# Patient Record
Sex: Male | Born: 1975 | Race: Black or African American | Hispanic: No | Marital: Married | Smoking: Never smoker
Health system: Southern US, Community
[De-identification: ages and names within clinical notes are randomized; demographics above are authoritative.]

---

## 1999-02-12 ENCOUNTER — Emergency Department (HOSPITAL_COMMUNITY): Admission: EM | Admit: 1999-02-12 | Discharge: 1999-02-12 | Payer: Self-pay | Admitting: Internal Medicine

## 1999-02-27 ENCOUNTER — Emergency Department (HOSPITAL_COMMUNITY): Admission: EM | Admit: 1999-02-27 | Discharge: 1999-02-27 | Payer: Self-pay | Admitting: Internal Medicine

## 2000-05-14 ENCOUNTER — Emergency Department (HOSPITAL_COMMUNITY): Admission: EM | Admit: 2000-05-14 | Discharge: 2000-05-14 | Payer: Self-pay | Admitting: Emergency Medicine

## 2000-06-08 ENCOUNTER — Emergency Department (HOSPITAL_COMMUNITY): Admission: EM | Admit: 2000-06-08 | Discharge: 2000-06-08 | Payer: Self-pay | Admitting: Emergency Medicine

## 2000-11-13 ENCOUNTER — Encounter: Payer: Self-pay | Admitting: Emergency Medicine

## 2000-11-13 ENCOUNTER — Emergency Department (HOSPITAL_COMMUNITY): Admission: EM | Admit: 2000-11-13 | Discharge: 2000-11-13 | Payer: Self-pay

## 2001-12-20 ENCOUNTER — Emergency Department (HOSPITAL_COMMUNITY): Admission: EM | Admit: 2001-12-20 | Discharge: 2001-12-20 | Payer: Self-pay | Admitting: Emergency Medicine

## 2001-12-20 ENCOUNTER — Encounter: Payer: Self-pay | Admitting: Emergency Medicine

## 2001-12-21 ENCOUNTER — Emergency Department (HOSPITAL_COMMUNITY): Admission: EM | Admit: 2001-12-21 | Discharge: 2001-12-21 | Payer: Self-pay | Admitting: Emergency Medicine

## 2002-06-29 ENCOUNTER — Inpatient Hospital Stay (HOSPITAL_COMMUNITY): Admission: EM | Admit: 2002-06-29 | Discharge: 2002-07-01 | Payer: Self-pay | Admitting: Emergency Medicine

## 2002-06-29 ENCOUNTER — Encounter: Payer: Self-pay | Admitting: Emergency Medicine

## 2002-06-30 ENCOUNTER — Encounter: Payer: Self-pay | Admitting: Specialist

## 2002-07-13 ENCOUNTER — Encounter: Admission: RE | Admit: 2002-07-13 | Discharge: 2002-08-11 | Payer: Self-pay

## 2002-10-28 ENCOUNTER — Emergency Department (HOSPITAL_COMMUNITY): Admission: EM | Admit: 2002-10-28 | Discharge: 2002-10-28 | Payer: Self-pay | Admitting: Emergency Medicine

## 2006-06-14 ENCOUNTER — Emergency Department (HOSPITAL_COMMUNITY): Admission: EM | Admit: 2006-06-14 | Discharge: 2006-06-14 | Payer: Self-pay | Admitting: Family Medicine

## 2008-08-05 ENCOUNTER — Emergency Department (HOSPITAL_COMMUNITY): Admission: EM | Admit: 2008-08-05 | Discharge: 2008-08-05 | Payer: Self-pay | Admitting: Emergency Medicine

## 2008-11-30 ENCOUNTER — Ambulatory Visit: Payer: Self-pay | Admitting: Gastroenterology

## 2008-11-30 ENCOUNTER — Inpatient Hospital Stay (HOSPITAL_COMMUNITY): Admission: EM | Admit: 2008-11-30 | Discharge: 2008-12-01 | Payer: Self-pay | Admitting: Family Medicine

## 2008-12-01 ENCOUNTER — Encounter: Payer: Self-pay | Admitting: Gastroenterology
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
## 2010-03-26 ENCOUNTER — Emergency Department (HOSPITAL_COMMUNITY): Admission: EM | Admit: 2010-03-26 | Discharge: 2010-03-26 | Payer: Self-pay | Admitting: Emergency Medicine

## 2010-07-03 ENCOUNTER — Emergency Department (HOSPITAL_COMMUNITY): Admission: EM | Admit: 2010-07-03 | Discharge: 2010-07-03 | Payer: Self-pay | Admitting: Emergency Medicine

## 2010-08-02 DIAGNOSIS — M545 Low back pain, unspecified: Secondary | ICD-10-CM | POA: Insufficient documentation

## 2010-10-31 ENCOUNTER — Emergency Department (HOSPITAL_COMMUNITY)
Admission: EM | Admit: 2010-10-31 | Discharge: 2010-10-31 | Payer: Self-pay | Source: Home / Self Care | Admitting: Emergency Medicine

## 2010-11-01 DIAGNOSIS — R209 Unspecified disturbances of skin sensation: Secondary | ICD-10-CM | POA: Insufficient documentation

## 2010-12-14 ENCOUNTER — Ambulatory Visit
Admission: RE | Admit: 2010-12-14 | Discharge: 2010-12-14 | Payer: Self-pay | Source: Home / Self Care | Attending: Nurse Practitioner | Admitting: Nurse Practitioner

## 2010-12-14 ENCOUNTER — Encounter (INDEPENDENT_AMBULATORY_CARE_PROVIDER_SITE_OTHER): Payer: Self-pay | Admitting: Nurse Practitioner

## 2010-12-14 DIAGNOSIS — F17200 Nicotine dependence, unspecified, uncomplicated: Secondary | ICD-10-CM | POA: Insufficient documentation

## 2011-01-03 NOTE — Letter (Signed)
Summary: Handout Printed  Printed Handout:  - Sciatica-Brief

## 2011-01-03 NOTE — Letter (Signed)
Summary: Handout Printed  Printed Handout:  - Back Pain 

## 2011-01-03 NOTE — Assessment & Plan Note (Signed)
Summary: NEW - Establish care   Vital Signs:  Patient profile:   35 year old male Height:      74 inches Weight:      303.8 pounds BMI:     39.15 Temp:     97.2 degrees F oral Pulse rate:   80 / minute Pulse rhythm:   regular Resp:     20 per minute BP sitting:   120 / 76  (left arm) Cuff size:   regular  Vitals Entered By: Levon Hedger (December 14, 2010 3:15 PM)  Nutrition Counseling: Patient's BMI is greater than 25 and therefore counseled on weight management options. CC: new pt .Marland Kitchen..follow-up visit [facility] pt received medication from that visit that he could not get filled with the orange card., Back Pain Is Patient Diabetic? No Pain Assessment Patient in pain? yes     Location: hip, lower Intensity: 10  Does patient need assistance? Functional Status Self care Ambulation Normal   CC:  new pt .Marland Kitchen..follow-up visit [facility] pt received medication from that visit that he could not get filled with the orange card. and Back Pain.  History of Present Illness:  Pt into the office to establish care.  No PMH PSH - ? appendectomy during childhood  Pt went to the ER at [facility] about 1 month ago. States that back pain started in the lower part of his back with radiation into the right leg and into the foot. Numbness in the right leg. Significant other advised pt to go the ER in December. "I can feel my leg but my foot is so numb it feels like somebody is tickling my leg with pens" Denies any injury to the lower back No X-rays done when pt went to the ER.  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  The pain is located in the lower back region and does not radiate below the knees.  He states this is not work related.  He states that he has no prior history of back pain.  The patient has not had any recent physical therapy for his back pain.  The following makes the back  pain better: aleve.  The following makes the back pain worse: activity as the day progresses.    Critical Exclusionary Diagnosis Criteria (CEDC) for Back Pain:      The patient denies a history of previous trauma.  He has no prior history of spinal surgery.  There are no symptoms to suggest infection, cancer, cauda equina, or psychosocial factors for back pain.  Other positive CEDC factors include low back pain worse with lumbar extension (downhill walking-standing-reaching overhead).     Habits & Providers  Alcohol-Tobacco-Diet     Alcohol drinks/day: <1     Tobacco Status: quit     Tobacco Counseling: to quit use of tobacco products     Cigarette Packs/Day: 0.5     Year Started: age 15  Exercise-Depression-Behavior     Drug Use: never  Allergies (verified): 1)  ! Penicillin  Past History:  Past Surgical History: Appendectomy during childhood  Family History: mother - diabetes (deceased at age 40) father - colon cancer (deceased at age 29) 2 brothers - healthy 2 sisters - healthy  Social History: no childrenSmoking Status:  quit Drug Use:  never Packs/Day:  0.5  Review of Systems CV:  Denies chest pain or discomfort. Resp:  Denies cough. GI:  Denies abdominal pain, nausea, and vomiting. MS:  Complains of low back pain. Neuro:  Complains of numbness; right leg.  Physical Exam  General:  alert.   Head:  normocephalic.   Lungs:  normal breath sounds.   Heart:  normal rate and regular rhythm.   Abdomen:  normal bowel sounds.   Neurologic:  alert & oriented X3.   Skin:  color normal.   Psych:  Oriented X3.    Low Back Pain Physical Exam:    Inspection-deformity:     No    Palpation-spinal tenderness:   Yes   Impression & Recommendations:  Problem # 1:  TOBACCO ABUSE (ICD-305.1) pt has quit smoking as of January 1st, 2012 congrats to pt  Problem # 2:  LUMBAGO (ICD-724.2) reviewed with pt  advised dx with pt His updated medication list for this problem  includes:    Flexeril 10 Mg Tabs (Cyclobenzaprine hcl) ..... One tablet by mouth nightly as needed for muscle    Ibuprofen 800 Mg Tabs (Ibuprofen) ..... One tablet by mouth two times a day for as needed for pain  Orders: Radiology other (Radiology Other)  Problem # 3:  NUMBNESS (ICD-782.0) most likely due to sciatica  Complete Medication List: 1)  Flexeril 10 Mg Tabs (Cyclobenzaprine hcl) .... One tablet by mouth nightly as needed for muscle 2)  Prednisone 10 Mg Tabs (Prednisone) .... Taper from 60mg  to 0 mg over 1 week 3)  Ibuprofen 800 Mg Tabs (Ibuprofen) .... One tablet by mouth two times a day for as needed for pain  Patient Instructions: 1)  Friday - Ibuprofen 800mg  by mouth with dinner 2)  Flexeril 10mg  by mouth nightly for muslces  (this will make you sleepy) 3)  Saturday - Prednisone - 6 tablets (may take all at once) 4)  Ibuprofen 800mg  by mouth after breakfast and after dinner 5)  Flexeril 10mg  by mouth before bedtime 6)  Sunday -  Prednisone - 5 tablets (may take all at once) 7)  Ibuprofen 800mg  by mouth after breakfast and after dinner 8)  Flexeril 10mg  by mouth before bedtime 9)  Monday -  Prednisone - 4 tablets (may take all at once) 10)  Ibuprofen 800mg  by mouth after breakfast and after dinner 11)  Flexeril 10mg  by mouth before bedtime 12)  Tuesday -  Prednisone - 3 tablets (may take all at once) 13)  Ibuprofen 800mg  by mouth after breakfast and after dinner 14)  Flexeril 10mg  by mouth before bedtime 15)  Wednesday -  Prednisone - 2 tablets (may take all at once) 16)  Ibuprofen 800mg  by mouth after breakfast and after dinner 17)  Flexeril 10mg  by mouth before bedtime 18)  Thursday -  Prednisone - 1 tablet  19)  Ibuprofen 800mg  by mouth after breakfast and after dinner 20)  Flexeril 10mg  by mouth before bedtime 21)  After Thursday may take ibuprofen and flexeril as needed  22)  Avoid any activity that will cause excessive bending of the lower back 23)  if no  relief then get x-ray done at [facility].  you must take the order slip with you but you do not need an appointment. 24)  Follow up with n.martin,fnp in 4 weeks for back pain Prescriptions: IBUPROFEN 800 MG TABS (IBUPROFEN) One tablet by mouth two times a day for as needed for pain  #50 x 0   Entered and Authorized by:   Lehman Prom  FNP   Signed by:   Lehman Prom FNP on 12/14/2010   Method used:   Print then Give to Patient   RxID:   [id number] FLEXERIL 10 MG TABS (CYCLOBENZAPRINE HCL) One tablet by mouth nightly as needed for muscle  #30 x 0   Entered and Authorized by:   Lehman Prom FNP   Signed by:   Lehman Prom FNP on 12/14/2010   Method used:   Print then Give to Patient   RxID:   [id number] PREDNISONE 10 MG TABS (PREDNISONE) Taper from 60mg  to 0 mg over 1 week  #21 x 0   Entered and Authorized by:   Lehman Prom FNP   Signed by:   Lehman Prom FNP on 12/14/2010   Method used:   Print then Give to Patient   RxID:   [id number] IBUPROFEN 800 MG TABS (IBUPROFEN) One tablet by mouth two times a day for as needed for pain  #50 x 0   Entered and Authorized by:   Lehman Prom FNP   Signed by:   Lehman Prom FNP on 12/14/2010   Method used:   Print then Give to Patient   RxID:   [id number] PREDNISONE 10 MG TABS (PREDNISONE) Taper from 60mg  to 0 mg over 1 week  #12 x 0   Entered and Authorized by:   Lehman Prom FNP   Signed by:   Lehman Prom FNP on 12/14/2010   Method used:   Print then Give to Patient   RxID:   [id number] FLEXERIL 10 MG TABS (CYCLOBENZAPRINE HCL) One tablet by mouth nightly as needed for muscle  #30 x 0   Entered and Authorized by:   Lehman Prom FNP   Signed by:   Lehman Prom FNP on 12/14/2010   Method used:   Print then Give to Patient   RxID:   [id number]    Orders Added: 1)  New Patient Level III [09323] 2)  Radiology other [Radiology Other]

## 2011-01-07 ENCOUNTER — Other Ambulatory Visit (HOSPITAL_COMMUNITY): Payer: Self-pay | Admitting: Internal Medicine

## 2011-01-07 ENCOUNTER — Telehealth (INDEPENDENT_AMBULATORY_CARE_PROVIDER_SITE_OTHER): Payer: Self-pay | Admitting: Nurse Practitioner

## 2011-01-07 ENCOUNTER — Ambulatory Visit (HOSPITAL_COMMUNITY)
Admission: RE | Admit: 2011-01-07 | Discharge: 2011-01-07 | Disposition: A | Discharge status: Home / Self Care | Payer: Self-pay | Source: Ambulatory Visit | Attending: Internal Medicine | Admitting: Internal Medicine

## 2011-01-07 DIAGNOSIS — M545 Low back pain, unspecified: Secondary | ICD-10-CM | POA: Insufficient documentation

## 2011-01-07 DIAGNOSIS — R52 Pain, unspecified: Secondary | ICD-10-CM

## 2011-01-07 DIAGNOSIS — IMO0002 Reserved for concepts with insufficient information to code with codable children: Secondary | ICD-10-CM | POA: Insufficient documentation

## 2011-01-14 ENCOUNTER — Encounter (INDEPENDENT_AMBULATORY_CARE_PROVIDER_SITE_OTHER): Payer: Self-pay | Admitting: Nurse Practitioner

## 2011-01-14 ENCOUNTER — Encounter: Payer: Self-pay | Admitting: Nurse Practitioner

## 2011-01-14 ENCOUNTER — Other Ambulatory Visit (HOSPITAL_COMMUNITY): Payer: Self-pay | Admitting: Internal Medicine

## 2011-01-14 DIAGNOSIS — M545 Low back pain: Secondary | ICD-10-CM

## 2011-01-17 NOTE — Progress Notes (Signed)
Summary: X-ray results  Phone Note Outgoing Call   Summary of Call: advised pt that her x-ray shows arthritis in his lower back.  This is likely the cause of symptoms. he should continue anti-inflammatories (ibuprofen) Initial call taken by: Lehman Prom FNP,  January 07, 2011 5:42 PM  Follow-up for Phone Call        pt says he is out of ibuprofen.Marland KitchenMarland KitchenMarland KitchenMarland Kitchen [facility] [address]... please call pt to make aware that med was sent to pharmacy Follow-up by: [place] Shannon,  January 08, 2011 2:49 PM  Additional Follow-up for Phone Call Additional follow up Details #1::        Rx sent [facility] to [facility] - notify pt Additional Follow-up by: Lehman Prom FNP,  January 09, 2011 8:44 AM  New Problems: DEGENERATIVE DISC DISEASE (ICD-722.6) LUMBAGO (ICD-724.2)   Additional Follow-up for Phone Call Additional follow up Details #2::    Left message on answering machine for pt to call back......Marland KitchenArmenia Shannon  January 09, 2011 9:02 AM   pt is aware Follow-up by: [place] Shannon,  January 09, 2011 11:29 AM  New Problems: DEGENERATIVE DISC DISEASE (ICD-722.6) LUMBAGO (ICD-724.2) Prescriptions: IBUPROFEN 800 MG TABS (IBUPROFEN) One tablet by mouth two times a day for as needed for pain  #50 x 1   Entered and Authorized by:   Lehman Prom FNP   Signed by:   Lehman Prom FNP on 01/09/2011   Method used:   Electronically to        [facility] [phone]* (retail)       [address]       [place], [place]  [address]       Ph: [phone]       Fax: [phone]   RxID:   [id number]  Phone Note Outgoing Call   Summary of Call: advised pt that her x-ray shows arthritis in his lower back.  This is likely the cause of symptoms. he should continue anti-inflammatories (ibuprofen) Initial call taken by: Lehman Prom FNP,  January 07, 2011 5:42 PM  Follow-up for Phone Call        pt says he is out of ibuprofen.Marland KitchenMarland KitchenMarland KitchenMarland Kitchen [facility] [address]... please call pt to make  aware that med was sent to pharmacy Follow-up by: [place] Shannon,  January 08, 2011 2:49 PM  Additional Follow-up for Phone Call Additional follow up Details #1::        [facility] to [facility] - notify pt Additional Follow-up by: Lehman Prom FNP,  January 09, 2011 8:44 AM  New Problems: DEGENERATIVE DISC DISEASE (ICD-722.6) LUMBAGO (ICD-724.2)   Additional Follow-up for Phone Call Additional follow up Details #2::    Left message on answering machine for pt to call back......Marland KitchenArmenia Shannon  January 09, 2011 9:02 AM   pt is aware Follow-up by: [place] Shannon,  January 09, 2011 11:29 AM  New Problems: DEGENERATIVE DISC DISEASE (ICD-722.6) LUMBAGO (ICD-724.2)

## 2011-01-23 NOTE — Assessment & Plan Note (Signed)
Summary: Back Pain   Vital Signs:  Patient profile:   35 year old male Weight:      308.3 pounds Temp:     97.2 degrees F oral Pulse rate:   80 / minute Pulse rhythm:   regular Resp:     20 per minute BP sitting:   126 / 80  (left arm) Cuff size:   regular  Vitals Entered By: Levon Hedger (January 14, 2011 3:15 PM) CC: follow-up visit back pain...still having alot of pain radiating down to legs, Back Pain Is Patient Diabetic? No Pain Assessment Patient in pain? yes     Location: back, leg Intensity: 10  Does patient need assistance? Functional Status Self care Ambulation Normal   CC:  follow-up visit back pain...still having alot of pain radiating down to legs and Back Pain.  History of Present Illness:   Pt into the office for f/u on back pain. He has been to get x-ray since the last visit. Lumbar films show - Possible degenerative disc disease at L5-S1.  Otherwise unremarkable however pain persists. Pain from lower back with radiation into the hips and down into the legs R>L  He presents today with his significant other. Pt also has meds today as ordered and is able to verbalize how he has been taking each.  He has finished prednisone as ordered without relief  Back Pain History:      The patient's back pain has been present for > 6 weeks.  He states that he has had a prior history of back pain.  The patient has not had any recent physical therapy for his back pain.  The following makes the back pain better: sitting .  The following makes the back pain worse: standing for long periods .        Description of injury in patient's own words:  See previous note for full history.    Critical Exclusionary Diagnosis Criteria (CEDC) for Back Pain:      The patient denies a history of previous trauma.  He has no prior history of spinal surgery.  There are no symptoms to suggest infection, cancer, cauda equina, or psychosocial factors for back pain.  Other positive CEDC  factors include low back pain worse with lumbar extension (downhill walking-standing-reaching overhead).     Habits & Providers  Alcohol-Tobacco-Diet     Alcohol drinks/day: <1     Tobacco Status: quit     Tobacco Counseling: to quit use of tobacco products     Cigarette Packs/Day: 0.5     Year Started: age 69  Exercise-Depression-Behavior     Does Patient Exercise: no     Drug Use: never  Allergies (verified): 1)  ! Penicillin  Social History: Does Patient Exercise:  no  Review of Systems General:  Denies loss of appetite. CV:  Denies chest pain or discomfort. Resp:  Denies cough. GI:  Denies abdominal pain, nausea, and vomiting. MS:  Complains of low back pain. Neuro:  Complains of numbness and tingling.  Physical Exam  General:  alert.  obese Head:  normocephalic.   Neurologic:  alert & oriented X3.   no assitive device Psych:  Oriented X3.    Low Back Pain Physical Exam:    Inspection-deformity:     No    Palpation-spinal tenderness:   Yes       Location:   L4-L5   Detailed Back/Spine Exam  General:    obese.    Lumbosacral Exam:  Inspection-deformity:  Abnormal Palpation-spinal tenderness:  Abnormal    Location:  L4-L5   Impression & Recommendations:  Problem # 1:  LUMBAGO (ICD-724.2) Reviewed dx with pt will order MRI and review results with pt His updated medication list for this problem includes:    Flexeril 10 Mg Tabs (Cyclobenzaprine hcl) ..... Hold    Ibuprofen 800 Mg Tabs (Ibuprofen) ..... One tablet by mouth two times a day for as needed for pain    Tramadol Hcl 50 Mg Tabs (Tramadol hcl) .Marland Kitchen... 2 tablets by mouth two times a day as needed for pain  Orders: MRI without Contrast (MRI w/o Contrast)  Problem # 2:  TOBACCO ABUSE (ICD-305.1) advise cessation  Complete Medication List: 1)  Flexeril 10 Mg Tabs (Cyclobenzaprine hcl) .... Hold 2)  Ibuprofen 800 Mg Tabs (Ibuprofen) .... One tablet by mouth two times a day for as needed for  pain 3)  Gabapentin 300 Mg Caps (Gabapentin) .... One capsule by mouth nightly for 3 nights then increase to 2 capsules by mouth nightly 4)  Tramadol Hcl 50 Mg Tabs (Tramadol hcl) .... 2 tablets by mouth two times a day as needed for pain  Patient Instructions: 1)  Back pain - will order MRI  2)  Please be sure to keep this appointment 3)  STOP taking the cyclobenaprine 4)  Start neurontin 300mg  by mouth nightly for 3 days then increase to 2 capsules by mouth nightly.  This will make you sleepy but is helpful for the nerves. 5)  Ibuprofen 800mg  - take during the day after food. 6)  Pain - May take tramadol as needed for additional pain 7)  Follow up in 6 weeks for back pain.  You will likely be called with MRI results before this Prescriptions: TRAMADOL HCL 50 MG TABS (TRAMADOL HCL) 2 tablets by mouth two times a day as needed for pain  #90 x 0   Entered and Authorized by:   Lehman Prom FNP   Signed by:   Lehman Prom FNP on 01/14/2011   Method used:   Print then Give to Patient   RxID:   [id number] GABAPENTIN 300 MG CAPS (GABAPENTIN) One capsule by mouth nightly for 3 nights then increase to 2 capsules by mouth nightly  #60 x 0   Entered and Authorized by:   Lehman Prom FNP   Signed by:   Lehman Prom FNP on 01/14/2011   Method used:   Print then Give to Patient   RxID:   [id number]    Orders Added: 1)  Est. Patient Level III [95284] 2)  MRI without Contrast [MRI w/o Contrast]

## 2011-01-24 ENCOUNTER — Ambulatory Visit (HOSPITAL_COMMUNITY)
Admission: RE | Admit: 2011-01-24 | Discharge: 2011-01-24 | Disposition: A | Discharge status: Home / Self Care | Payer: Self-pay | Source: Ambulatory Visit | Attending: Internal Medicine | Admitting: Internal Medicine

## 2011-01-24 DIAGNOSIS — M51379 Other intervertebral disc degeneration, lumbosacral region without mention of lumbar back pain or lower extremity pain: Secondary | ICD-10-CM | POA: Insufficient documentation

## 2011-01-24 DIAGNOSIS — M545 Low back pain, unspecified: Secondary | ICD-10-CM | POA: Insufficient documentation

## 2011-01-24 DIAGNOSIS — M519 Unspecified thoracic, thoracolumbar and lumbosacral intervertebral disc disorder: Secondary | ICD-10-CM | POA: Insufficient documentation

## 2011-01-24 DIAGNOSIS — M5137 Other intervertebral disc degeneration, lumbosacral region: Secondary | ICD-10-CM | POA: Insufficient documentation

## 2011-01-24 DIAGNOSIS — M5126 Other intervertebral disc displacement, lumbar region: Secondary | ICD-10-CM | POA: Insufficient documentation

## 2011-01-29 ENCOUNTER — Telehealth (INDEPENDENT_AMBULATORY_CARE_PROVIDER_SITE_OTHER): Payer: Self-pay | Admitting: Nurse Practitioner

## 2011-01-29 DIAGNOSIS — M5126 Other intervertebral disc displacement, lumbar region: Secondary | ICD-10-CM | POA: Insufficient documentation

## 2011-02-07 NOTE — Progress Notes (Signed)
Summary: MRI results  Phone Note Outgoing Call   Summary of Call: notify pt that his MRI does show a problem with the disk and nerves in his back I am going to refer him to neurosurgery for further work up continue to take medications as ordered and we will be in contact with him regarding the time/date of his appt Initial call taken by: Lehman Prom FNP,  January 29, 2011 6:17 PM  Follow-up for Phone Call        Pt. advised of MRI results and f/u referral. Verbalized understanding and agreement.  Dutch Quint RN  January 30, 2011 11:59 AM

## 2011-02-26 ENCOUNTER — Encounter (INDEPENDENT_AMBULATORY_CARE_PROVIDER_SITE_OTHER): Payer: Self-pay | Admitting: Nurse Practitioner

## 2011-02-26 ENCOUNTER — Encounter: Payer: Self-pay | Admitting: Nurse Practitioner

## 2011-03-05 NOTE — Assessment & Plan Note (Signed)
Summary: F/u MRI   Vital Signs:  Patient profile:   35 year old male Weight:      308.9 pounds BMI:     39.80 Temp:     97.4 degrees F oral Pulse rate:   53 / minute Pulse rhythm:   regular Resp:     16 per minute BP sitting:   126 / 84  (left arm) Cuff size:   regular  Vitals Entered By: Levon Hedger (February 26, 2011 11:50 AM)  Nutrition Counseling: Patient's BMI is greater than 25 and therefore counseled on weight management options. CC: follow-up visit 6 weeks back pain Is Patient Diabetic? No Pain Assessment Patient in pain? yes     Location: back Intensity: 7-8  Does patient need assistance? Functional Status Self care Ambulation Normal Comments pt states he has been out of Ibuprofen x 1 week   CC:  follow-up visit 6 weeks back pain.  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 Prominent dorsal epidural fat.  Facet and ligamentous hypertrophy. Spinal stenosis because of the combination of these factors could result in symptoms.  Mild disc bulge at L4-5.  Mild facet and ligamentous hypertrophy. No compressive stenosis.  L5-S1:  Right paracentral disc herniation contacts the right S1 nerve root but does not appear to cause gross neural compression. Mild facet and ligamentous hypertrophy.  Pt has been to neurosurgery as ordered: Options are surgery or cortisone injections Pt is fearful of surgery.  Habits & Providers  Alcohol-Tobacco-Diet     Alcohol drinks/day: <1     Tobacco Status: quit     Tobacco Counseling: to quit use of tobacco products     Cigarette Packs/Day: 0.5     Year Started: age 74  Exercise-Depression-Behavior     Does Patient Exercise: no     Have you felt down or hopeless? no     Have you felt little pleasure in things? no     Depression Counseling: not indicated; screening negative for depression     Drug Use: never  Allergies  (verified): 1)  ! Penicillin  Review of Systems General:  Denies fever. CV:  Denies chest pain or discomfort. Resp:  Denies cough. GI:  Denies abdominal pain, nausea, and vomiting. MS:  Complains of low back pain.  Physical Exam  General:  alert.   Head:  normocephalic.   Lungs:  normal breath sounds.   Heart:  normal rate and regular rhythm.   Abdomen:  normal bowel sounds.   Neurologic:  alert & oriented X3.     Detailed Back/Spine Exam  General:    obese.    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 Hold 2)  Ibuprofen 800 Mg Tabs (Ibuprofen) .... One tablet by mouth two times a day for as needed for pain 3)  Gabapentin 300 Mg Caps (Gabapentin) .... One capsule by mouth nightly for 3 nights then increase to 2 capsules by mouth nightly 4)  Tramadol Hcl 50 Mg Tabs (Tramadol hcl) .... 2 tablets by mouth two times a day as needed for pain  Patient Instructions: 1)  Schedule a follow up appointment with Neurosurgery. 2)  Debra to see if she can schedule follow up appointment at [facility] - initial visit earlier this month and he was told to call for a follow-up 3)  Follow here as needed Prescriptions: IBUPROFEN 800 MG TABS (IBUPROFEN) One tablet by mouth two times a day for as needed for pain  #50 x 1   Entered and Authorized by:   Lehman Prom FNP   Signed by:   Lehman Prom FNP on 02/26/2011   Method used:   Printed then faxed to ...       [facility] [address] [phone]* (retail)       [address] [place] [address]       [place], [place]  [address]       Ph: [phone]       Fax: [phone]   RxID:    [id number]    Orders Added: 1)  Est. Patient Level III [53664]
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
## 2011-09-05 LAB — LIPID PANEL
Cholesterol: 155 mg/dL (ref 0–200)
HDL: 37 mg/dL — ABNORMAL LOW (ref >39)
LDL Cholesterol: 100 mg/dL — ABNORMAL HIGH (ref 0–99)
Total CHOL/HDL Ratio: 4.2 RATIO
Triglycerides: 89 mg/dL (ref <150)
VLDL: 18 mg/dL (ref 0–40)

## 2011-09-05 LAB — COMPREHENSIVE METABOLIC PANEL
ALT: 16 U/L (ref 0–53)
AST: 20 U/L (ref 0–37)
Albumin: 3.3 g/dL — ABNORMAL LOW (ref 3.5–5.2)
Albumin: 3.7 g/dL (ref 3.5–5.2)
Alkaline Phosphatase: 43 U/L (ref 39–117)
BUN: 13 mg/dL (ref 6–23)
BUN: 16 mg/dL (ref 6–23)
CO2: 25 mEq/L (ref 19–32)
Calcium: 8.7 mg/dL (ref 8.4–10.5)
Calcium: 8.9 mg/dL (ref 8.4–10.5)
Chloride: 104 mEq/L (ref 96–112)
Chloride: 105 mEq/L (ref 96–112)
Creatinine, Ser: 1.03 mg/dL (ref 0.4–1.5)
Creatinine, Ser: 1.34 mg/dL (ref 0.4–1.5)
GFR calc Af Amer: >60 mL/min (ref >60)
GFR calc Af Amer: >60 mL/min (ref >60)
GFR calc non Af Amer: >60 mL/min (ref >60)
GFR calc non Af Amer: >60 mL/min (ref >60)
Glucose, Bld: 97 mg/dL (ref 70–99)
Potassium: 4.1 mEq/L (ref 3.5–5.1)
Sodium: 138 mEq/L (ref 135–145)
Total Bilirubin: 0.4 mg/dL (ref 0.3–1.2)
Total Bilirubin: 0.5 mg/dL (ref 0.3–1.2)
Total Protein: 6.6 g/dL (ref 6.0–8.3)

## 2011-09-05 LAB — CBC
HCT: 43.5 % (ref 39.0–52.0)
Hemoglobin: 14.4 g/dL (ref 13.0–17.0)
Hemoglobin: 14.7 g/dL (ref 13.0–17.0)
MCHC: 33.3 g/dL (ref 30.0–36.0)
MCHC: 34.1 g/dL (ref 30.0–36.0)
MCV: 91.7 fL (ref 78.0–100.0)
MCV: 93.5 fL (ref 78.0–100.0)
Platelets: 224 10*3/uL (ref 150–400)
RBC: 4.65 MIL/uL (ref 4.22–5.81)
RBC: 4.7 MIL/uL (ref 4.22–5.81)
RDW: 13.8 % (ref 11.5–15.5)
WBC: 5.5 10*3/uL (ref 4.0–10.5)
WBC: 7.2 10*3/uL (ref 4.0–10.5)

## 2011-09-05 LAB — URINALYSIS, ROUTINE W REFLEX MICROSCOPIC
Bilirubin Urine: NEGATIVE
Glucose, UA: NEGATIVE mg/dL
Hgb urine dipstick: NEGATIVE
Ketones, ur: NEGATIVE mg/dL
Nitrite: NEGATIVE
Protein, ur: NEGATIVE mg/dL
Specific Gravity, Urine: 1.017 (ref 1.005–1.030)
Urobilinogen, UA: 0.2 mg/dL (ref 0.0–1.0)
pH: 5.5 (ref 5.0–8.0)

## 2011-09-05 LAB — DIFFERENTIAL
Basophils Absolute: 0 10*3/uL (ref 0.0–0.1)
Basophils Absolute: 0 10*3/uL (ref 0.0–0.1)
Basophils Relative: 0 % (ref 0–1)
Eosinophils Absolute: 0.2 10*3/uL (ref 0.0–0.7)
Eosinophils Absolute: 0.2 10*3/uL (ref 0.0–0.7)
Eosinophils Relative: 3 % (ref 0–5)
Eosinophils Relative: 3 % (ref 0–5)
Lymphocytes Relative: 27 % (ref 12–46)
Lymphs Abs: 1.5 10*3/uL (ref 0.7–4.0)
Lymphs Abs: 2.3 10*3/uL (ref 0.7–4.0)
Monocytes Absolute: 0.6 10*3/uL (ref 0.1–1.0)
Monocytes Relative: 11 % (ref 3–12)
Neutro Abs: 3.2 10*3/uL (ref 1.7–7.7)
Neutrophils Relative %: 56 % (ref 43–77)
Neutrophils Relative %: 58 % (ref 43–77)

## 2011-09-05 LAB — SAMPLE TO BLOOD BANK

## 2011-09-05 LAB — HEMATOCRIT: HCT: 42.7 % (ref 39.0–52.0)

## 2011-09-05 LAB — HEMOGLOBIN AND HEMATOCRIT, BLOOD
HCT: 41.7 % (ref 39.0–52.0)
HCT: 42.1 % (ref 39.0–52.0)
Hemoglobin: 14 g/dL (ref 13.0–17.0)
Hemoglobin: 14.2 g/dL (ref 13.0–17.0)

## 2011-09-05 LAB — LIPASE, BLOOD: Lipase: 19 U/L (ref 11–59)

## 2011-09-05 LAB — GLUCOSE, CAPILLARY
Glucose-Capillary: 114 mg/dL — ABNORMAL HIGH (ref 70–99)
Glucose-Capillary: 141 mg/dL — ABNORMAL HIGH (ref 70–99)

## 2011-09-05 LAB — HEMOGLOBIN: Hemoglobin: 14.4 g/dL (ref 13.0–17.0)

## 2011-09-05 LAB — TSH: TSH: 1.557 u[IU]/mL (ref 0.350–4.500)

## 2013-09-28 ENCOUNTER — Emergency Department (HOSPITAL_COMMUNITY): Payer: Self-pay

## 2013-09-28 ENCOUNTER — Encounter (HOSPITAL_COMMUNITY): Payer: Self-pay | Admitting: Emergency Medicine

## 2013-09-28 ENCOUNTER — Emergency Department (HOSPITAL_COMMUNITY)
Admission: EM | Admit: 2013-09-28 | Discharge: 2013-09-28 | Disposition: A | Discharge status: Home / Self Care | Payer: Self-pay | Attending: Emergency Medicine | Admitting: Emergency Medicine

## 2013-09-28 DIAGNOSIS — S8000XA Contusion of unspecified knee, initial encounter: Secondary | ICD-10-CM | POA: Insufficient documentation

## 2013-09-28 DIAGNOSIS — W19XXXA Unspecified fall, initial encounter: Secondary | ICD-10-CM

## 2013-09-28 DIAGNOSIS — M25561 Pain in right knee: Secondary | ICD-10-CM

## 2013-09-28 DIAGNOSIS — Y9389 Activity, other specified: Secondary | ICD-10-CM | POA: Insufficient documentation

## 2013-09-28 DIAGNOSIS — Z880 Allergy status to penicillin: Secondary | ICD-10-CM | POA: Insufficient documentation

## 2013-09-28 DIAGNOSIS — Y929 Unspecified place or not applicable: Secondary | ICD-10-CM | POA: Insufficient documentation

## 2013-09-28 DIAGNOSIS — S59909A Unspecified injury of unspecified elbow, initial encounter: Secondary | ICD-10-CM | POA: Insufficient documentation

## 2013-09-28 DIAGNOSIS — W11XXXA Fall on and from ladder, initial encounter: Secondary | ICD-10-CM | POA: Insufficient documentation

## 2013-09-28 DIAGNOSIS — IMO0001 Reserved for inherently not codable concepts without codable children: Secondary | ICD-10-CM

## 2013-09-28 DIAGNOSIS — S6990XA Unspecified injury of unspecified wrist, hand and finger(s), initial encounter: Secondary | ICD-10-CM | POA: Insufficient documentation

## 2013-09-28 DIAGNOSIS — M25571 Pain in right ankle and joints of right foot: Secondary | ICD-10-CM

## 2013-09-28 DIAGNOSIS — IMO0002 Reserved for concepts with insufficient information to code with codable children: Secondary | ICD-10-CM | POA: Insufficient documentation

## 2013-09-28 MED ORDER — DIAZEPAM 5 MG PO TABS: 5.0000 mg | ORAL_TABLET | Freq: Two times a day (BID) | ORAL | Status: DC

## 2013-09-28 MED ORDER — IBUPROFEN 600 MG PO TABS: 600.0000 mg | ORAL_TABLET | Freq: Four times a day (QID) | ORAL | Status: DC | PRN

## 2013-09-28 MED ORDER — HYDROCODONE-ACETAMINOPHEN 5-325 MG PO TABS
2.0000 | ORAL_TABLET | Freq: Once | ORAL | Status: AC
  Administered 2013-09-28: 2 via ORAL
  Filled 2013-09-28: qty 2

## 2013-09-28 NOTE — ED Provider Notes (Signed)
CSN: [id number]     Arrival date & time 09/28/13  2027 History  This chart was scribed for Aaron Finner, PA, working with Audree Camel, MD, by Ardelia Mems ED Scribe. This patient was seen in room WTR7/WTR7 and the patient's care was started at 10:31 PM.   Chief Complaint  Patient presents with  . Fall  . Arm Injury  . Leg Injury    The history is provided by the patient. No language interpreter was used.    HPI Comments: Aaron Douglas is a 37 y.o. male who presents to the Emergency Department complaining of a fall, 16 feet off of a ladder, while using a chainsaw on a tree, about 4 hours ago. He denies head injury or LOC pertaining to the fall. He states that he landed on his right side, and fell onto the ground. He states that he has had constant, moderate "aching, 8/10" right arm and right leg pain onset after the fall. He states that he is able to bend his right knee and that he is able to bear weight on his right leg with pain. He is in a wheelchair currently, which he received in the ED. He states that he has taken Aleve at home PTA without relief of pain. He states that he has no history of significant injuries to the areas of pain. He states that he does not have an Orthopedist. He denies neck pain, back pain or any other symptoms.    History reviewed. No pertinent past medical history. History reviewed. No pertinent past surgical history. History reviewed. No pertinent family history.  History  Substance Use Topics  . Smoking status: Never Smoker   . Smokeless tobacco: Never Used  . Alcohol Use: No    Review of Systems  Musculoskeletal: Negative for back pain and neck pain.       Right arm pain. Right leg pain.  Neurological: Negative for syncope and headaches.  All other systems reviewed and are negative.   Allergies  Penicillins  Home Medications   Current Outpatient Rx  Name  Route  Sig  Dispense  Refill  . naproxen (NAPROSYN) 250 MG tablet   Oral   Take  500 mg by mouth 2 (two) times daily as needed (PAIN).         Marland Kitchen diazepam (VALIUM) 5 MG tablet   Oral   Take 1 tablet (5 mg total) by mouth 2 (two) times daily.   10 tablet   0   . ibuprofen (ADVIL,MOTRIN) 600 MG tablet   Oral   Take 1 tablet (600 mg total) by mouth every 6 (six) hours as needed for pain.   30 tablet   0    Triage Vitals: BP 131/81  Pulse 86  Temp(Src) 98 F (36.7 C) (Oral)  Resp 16  Wt 308 lb (139.708 kg)  BMI 39.53 kg/m2  SpO2 100%  Physical Exam  Nursing note and vitals reviewed. Constitutional: He is oriented to person, place, and time. He appears well-developed and well-nourished.  HENT:  Head: Normocephalic and atraumatic.  Eyes: Conjunctivae and EOM are normal. No scleral icterus.  Neck: Normal range of motion. Neck supple.  No midline cervical tenderness, step-offs or crepitus. Full ROM.  Cardiovascular: Normal rate, regular rhythm and normal heart sounds.   Pulmonary/Chest: Effort normal and breath sounds normal. No respiratory distress.  Abdominal: Soft. He exhibits no distension. There is no tenderness.  Musculoskeletal: Normal range of motion. He exhibits tenderness. He exhibits no  edema.  No midline spinal tenderness, step-offs or crepitus. Full ROM. Tenderness along lateral aspect of right ankle and right knee and right buttock. Full ROM of right ankle and right knee and right hip. Mild ecchymosis over right patella. No edema.  Neurological: He is alert and oriented to person, place, and time. No cranial nerve deficit. Coordination normal.  Skin: Skin is warm and dry.    ED Course  Procedures (including critical care time)  DIAGNOSTIC STUDIES: Oxygen Saturation is 100% on RA, normal by my interpretation.    COORDINATION OF CARE: 10:35 PM- Discussed normal radiology findings with pt. Pt advised of plan for treatment and pt agrees.  Labs Review Labs Reviewed - No data to display Imaging Review Dg Lumbar Spine Complete  09/28/2013    CLINICAL DATA:  Fall with back pain  EXAM: LUMBAR SPINE - COMPLETE 4+ VIEW  COMPARISON:  02/20/2011  FINDINGS: There is no evidence of lumbar spine fracture. There is chronic mild wedging of the T12 vertebral body, remote or physiologic. Spurring at the SI joints, considered degenerative. Alignment is normal. Intervertebral disc spaces are maintained.  IMPRESSION: Negative.   Electronically Signed   By: Tiburcio Pea M.D.   On: 09/28/2013 22:24   Dg Forearm Right  09/28/2013   CLINICAL DATA:  Fall with arm injury  EXAM: RIGHT FOREARM - 2 VIEW  COMPARISON:  None.  FINDINGS: No evidence of fracture or malalignment. Mild enthesopathic changes around the elbow. Remote metacarpal fractures, 4th and 5th, as noted on previous radiography (06/14/2006).  IMPRESSION: No evidence of acute osseous injury.   Electronically Signed   By: Tiburcio Pea M.D.   On: 09/28/2013 22:15   Dg Ankle Complete Right  09/28/2013   CLINICAL DATA:  Fall with leg injury  EXAM: RIGHT ANKLE - COMPLETE 3+ VIEW  COMPARISON:  None.  FINDINGS: No evidence of acute fracture or malalignment. Chronic periosteal changes along the lateral distal tibial diaphysis is likely from remote syndesmotic injury.  IMPRESSION: No evidence of acute osseous injury.   Electronically Signed   By: Tiburcio Pea M.D.   On: 09/28/2013 22:22    EKG Interpretation   None       MDM   1. Fall, initial encounter   2. Radicular pain of right lower back   3. Right knee pain   4. Right ankle pain    Pt reports falling 14ft from ladder earlier today falling on grass. Denies hitting head or LOC. C/o right sided pain, greatest in right ankle. Pt A&Ox4. Head-atraumatic. Neck-no midline cervical tenderness, stepoffs or crepitus. Do not believe CT head or neck needed at this time. Plain films: negative for fx. Will place ace wrap on right ankle and Rx: crutches, valium and ibuprofen. F/u with PCP as needed for ongoing pain. Return precautions provided. Pt  verbalized understanding and agreement with tx plan.   I personally performed the services described in this documentation, which was scribed in my presence. The recorded information has been reviewed and is accurate.    Aaron Finner, PA-C 09/29/13 0151

## 2013-09-28 NOTE — ED Notes (Signed)
Pt fell off ladder and thinks arm is broken.  Pt had no LOC.  Pt alert and oriented.

## 2013-09-28 NOTE — ED Notes (Signed)
Pt reports he was using a chainsaw in a tree and fell onto his truck hitting his R arm and twisting his R leg under him, denies LOC or head injury.  Reports he took Aleve at home at 2000.

## 2013-09-28 NOTE — ED Notes (Signed)
Ortho tech at bedside 

## 2013-09-29 NOTE — ED Provider Notes (Signed)
Medical screening examination/treatment/procedure(s) were performed by non-physician practitioner and as supervising physician I was immediately available for consultation/collaboration.  EKG Interpretation   None         Kyson Kupper T Kaimana Lurz, MD 09/29/13 1114 

## 2018-12-19 ENCOUNTER — Emergency Department (HOSPITAL_COMMUNITY): Payer: Self-pay

## 2018-12-19 ENCOUNTER — Emergency Department (HOSPITAL_COMMUNITY)
Admission: EM | Admit: 2018-12-19 | Discharge: 2018-12-19 | Disposition: A | Discharge status: Home / Self Care | Payer: Self-pay | Attending: Emergency Medicine | Admitting: Emergency Medicine

## 2018-12-19 ENCOUNTER — Other Ambulatory Visit: Payer: Self-pay

## 2018-12-19 DIAGNOSIS — R0789 Other chest pain: Secondary | ICD-10-CM | POA: Insufficient documentation

## 2018-12-19 DIAGNOSIS — Z79899 Other long term (current) drug therapy: Secondary | ICD-10-CM | POA: Insufficient documentation

## 2018-12-19 LAB — BASIC METABOLIC PANEL
Anion gap: 10 (ref 5–15)
BUN: 15 mg/dL (ref 6–20)
CHLORIDE: 103 mmol/L (ref 98–111)
CO2: 25 mmol/L (ref 22–32)
Calcium: 9.3 mg/dL (ref 8.9–10.3)
Creatinine, Ser: 1.2 mg/dL (ref 0.61–1.24)
GFR calc Af Amer: >60 mL/min (ref >60)
Glucose, Bld: 107 mg/dL — ABNORMAL HIGH (ref 70–99)
POTASSIUM: 4 mmol/L (ref 3.5–5.1)
Sodium: 138 mmol/L (ref 135–145)

## 2018-12-19 LAB — CBC
HEMATOCRIT: 44 % (ref 39.0–52.0)
Hemoglobin: 14.4 g/dL (ref 13.0–17.0)
MCH: 28.9 pg (ref 26.0–34.0)
MCHC: 32.7 g/dL (ref 30.0–36.0)
MCV: 88.4 fL (ref 80.0–100.0)
Platelets: 279 10*3/uL (ref 150–400)
RBC: 4.98 MIL/uL (ref 4.22–5.81)
RDW: 12.9 % (ref 11.5–15.5)
WBC: 5.8 10*3/uL (ref 4.0–10.5)
nRBC: 0 % (ref 0.0–0.2)

## 2018-12-19 LAB — I-STAT TROPONIN, ED
TROPONIN I, POC: 0.01 ng/mL (ref 0.00–0.08)
Troponin i, poc: 0.01 ng/mL (ref 0.00–0.08)

## 2018-12-19 MED ORDER — ASPIRIN 81 MG PO CHEW
324.0000 mg | CHEWABLE_TABLET | Freq: Once | ORAL | Status: AC
  Administered 2018-12-19: 324 mg via ORAL
  Filled 2018-12-19: qty 4

## 2018-12-19 NOTE — ED Triage Notes (Signed)
Patient arrived from home reporting chest pain when he woke up. He states he had to get out of the shower to sit down because he was still hurting. He went to work and felt chest pain intermittently, but after work, he felt the pain again and decided to come here.

## 2018-12-19 NOTE — ED Provider Notes (Signed)
[facility] EMERGENCY DEPARTMENT Provider Note   CSN: [id number] Arrival date & time: 12/19/18  1901     History   Chief Complaint No chief complaint on file.   HPI Aaron Douglas is a 43 y.o. male.  Who presents the emergency department chief complaint of chest pain.  Patient complains of 6 months of chest pain on the left side radiating into the left axilla and upper back.  Patient states that it mostly occurs when he is up and moving and improves when he rests.  He describes the pain as sharp, pinching, pressure-like, stabbing, radiating in the distribution described above.  The last for approximately 5 minutes at a time.  Patient states that it feels better when he sits down and puts his arms up over his head.  Patient states that over the past week he has had worsening and more frequent episodes.  This morning he awoke with the pain in his chest.  He had to get out of the shower and sit down on the toilet because of the pain in his chest today.  He states that the pain improved when he drove to work but when he got out of the car and walked in the pain worsened and he had to sit down and rest.  Patient states that when he left work today walking out to his car it worsened again and he decided to come to the ER for evaluation.  He has a distant history of tobacco abuse but has not smoked in several years.  He also stopped smoking marijuana about 4 years ago.  The patient has no known history of hypertension, hyperlipidemia or diabetes.  He states that he did have a DOT physical earlier this year.  He states he thinks that they drew blood and everything seemed to be okay at that time.  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  The pain in his chest does not seem to be worsened any specific movement.  Has no primary relatives with a history of heart disease or MI.  HPI  No past medical history on file.  Patient Active Problem List   Diagnosis Date Noted    . 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 DEGENERATIVE DISC DISEASE 01/07/2011  . TOBACCO ABUSE 12/14/2010  . NUMBNESS 11/01/2010  . LUMBAGO 08/02/2010    No past surgical history on file.      Home Medications    Prior to Admission medications   Medication Sig Start Date End Date Taking? Authorizing Provider  diazepam (VALIUM) 5 MG tablet Take 1 tablet (5 mg total) by mouth 2 (two) times daily. 09/28/13   Lurene Shadow, PA-C  ibuprofen (ADVIL,MOTRIN) 600 MG tablet Take 1 tablet (600 mg total) by mouth every 6 (six) hours as needed for pain. 09/28/13   Lurene Shadow, PA-C  naproxen (NAPROSYN) 250 MG tablet Take 500 mg by mouth 2 (two) times daily as needed (PAIN).    [provider]    Family History No family history on file.  Social History Social History   Tobacco Use  . Smoking status: Never Smoker  . Smokeless tobacco: Never Used  Substance Use Topics  . Alcohol use: No  . Drug use: No     Allergies   Penicillins   Review of Systems Review of Systems Ten systems reviewed and are negative for acute change, except as noted in the HPI.    Physical Exam  Updated Vital Signs BP 137/84   Pulse 86   Temp 98.1 F (36.7 C)   Resp 14   Ht 6\' 2"  (1.88 m)   Wt (!) 154.2 kg   SpO2 95%   BMI 43.65 kg/m   Physical Exam Vitals signs and nursing note reviewed.  Constitutional:      General: He is not in acute distress.    Appearance: He is well-developed. He is not diaphoretic.  HENT:     Head: Normocephalic and atraumatic.  Eyes:     General: No scleral icterus.    Conjunctiva/sclera: Conjunctivae normal.  Neck:     Musculoskeletal: Normal range of motion and neck supple.  Cardiovascular:     Rate and Rhythm: Normal rate and regular rhythm.     Heart sounds: Normal heart sounds.  Pulmonary:     Effort: Pulmonary effort is normal. No respiratory distress.     Breath sounds: Normal breath sounds.  Chest:     Breasts:        Left: Tenderness  present.       Comments: TTP along the left ant/inf chest wall which reproduces pain of complaint Abdominal:     Palpations: Abdomen is soft.     Tenderness: There is no abdominal tenderness.  Skin:    General: Skin is warm and dry.  Neurological:     Mental Status: He is alert.  Psychiatric:        Behavior: Behavior normal.      ED Treatments / Results  Labs (all labs ordered are listed, but only abnormal results are displayed) Labs Reviewed - No data to display  EKG EKG Interpretation  Date/Time:  Saturday December 19 2018 19:35:07 EST Ventricular Rate:  88 PR Interval:    QRS Duration: 83 QT Interval:  327 QTC Calculation: 396 R Axis:   25 Text Interpretation:  Sinus rhythm RSR' in V1 or V2, probably normal variant since last tracing no significant change Confirmed by Rolan BuccoBelfi, Melanie [phone]([id number]) on 12/19/2018 9:00:02 PM   Radiology No results found.  Procedures Procedures (including critical care time)  Medications Ordered in ED Medications - No data to display   Initial Impression / Assessment and Plan / ED Course  I have reviewed the triage vital signs and the nursing notes.  Pertinent labs & imaging results that were available during my care of the patient were reviewed by me and considered in my medical decision making (see chart for details).     43 year old male with no significant past medical history presents with complaint of chest pain.The emergent differential diagnosis of chest pain includes: Acute coronary syndrome, pericarditis, aortic dissection, pulmonary embolism, tension pneumothorax, pneumonia, and esophageal rupture.  The patient has a heart score of 3, 2- troponins.  EKG I personally reviewed and shows no signs of acute ischemia.  Personally reviewed the patient's 1 view chest x-ray which shows no acute abnormalities he is PERC negative and I have no suspicion for pulmonary embolus.  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  Discussed results and outpatient follow-up.  He is advised to follow-up for cardiac stress test should he continue to have symptoms.  Patient appears appropriate for discharge at this time   Final Clinical Impressions(s) / ED Diagnoses   Final diagnoses:  None    ED Discharge Orders  None       Arthor Captain, PA-C 12/19/18 2349    Rolan Bucco, MD 12/20/18 [phone]

## 2018-12-19 NOTE — Discharge Instructions (Signed)
You have been diagnosed by your caregiver as having chest wall pain. °SEEK IMMEDIATE MEDICAL ATTENTION IF: °You develop a fever.  °Your chest pains become severe or intolerable.  °You develop new, unexplained symptoms (problems).  °You develop shortness of breath, nausea, vomiting, sweating or feel light headed.  °You develop a new cough or you cough up blood. ° °

## 2019-05-06 ENCOUNTER — Encounter (HOSPITAL_COMMUNITY): Payer: Self-pay | Admitting: *Deleted

## 2019-05-06 NOTE — Progress Notes (Signed)
Referral received from Dr. 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  Reviewed pt medical history. Called and spoke to pt and pt wife regarding cardiac rehab here at [facility].  Presently we are closed to group exercise but we are offering at no cost to him virtual cardiac rehab. Where he would download an app to his smart device and log exercise sessions, vital signs and watch educational videos.  At some point during his participation he would then be able to come into our facility for group exercise when permitted.  Pt wife indicated that with his particular insurance [employer] he would have to go to a [facility] for coverage.  [facility] is considered out of network and his services would not be covered.  Advised pt and his wife of [facility] which is [facility]  for possible option.  They are operating their phase II program on a reduced level.  Will contact amy Loree Fee coordinator for an update.  Pt thanked me for my call. Alanson Aly, BSN Cardiac and Emergency planning/management officer

## 2019-08-12 ENCOUNTER — Other Ambulatory Visit: Payer: Self-pay

## 2019-08-12 DIAGNOSIS — Z20822 Contact with and (suspected) exposure to covid-19: Secondary | ICD-10-CM

## 2019-08-13 LAB — NOVEL CORONAVIRUS, NAA: SARS-CoV-2, NAA: DETECTED — AB

## 2019-08-14 ENCOUNTER — Telehealth: Payer: Self-pay

## 2019-08-14 NOTE — Telephone Encounter (Signed)
Patient returned call for COVID19 results - DOB verified - results given. Reviewed Positive protocol with patient. Patient reports running low grade temperature (99.6) taking OTC Aleve. Temperature is now 98.5. No further questions/concerns.

## 2019-08-16 ENCOUNTER — Telehealth: Payer: Self-pay | Admitting: Internal Medicine

## 2019-08-16 NOTE — Telephone Encounter (Signed)
Patient called, informed of COVID + Result. He is already aware and quarantining with wife. All questions answered and his concerns addressed. Health Dept. Notified.  Angelica Chessman, MD  08/16/19

## 2021-07-25 ENCOUNTER — Ambulatory Visit: Payer: 59 | Admitting: Podiatry
